# Patient Record
Sex: Female | Born: 1965
Health system: Southern US, Community
[De-identification: ages and names within clinical notes are randomized; demographics above are authoritative.]

## PROBLEM LIST (undated history)

## (undated) DIAGNOSIS — K6289 Other specified diseases of anus and rectum: Secondary | ICD-10-CM

## (undated) DIAGNOSIS — K602 Anal fissure, unspecified: Secondary | ICD-10-CM

## (undated) DIAGNOSIS — IMO0002 Reserved for concepts with insufficient information to code with codable children: Secondary | ICD-10-CM

## (undated) DIAGNOSIS — K649 Unspecified hemorrhoids: Secondary | ICD-10-CM

## (undated) DIAGNOSIS — Z87898 Personal history of other specified conditions: Secondary | ICD-10-CM

## (undated) HISTORY — DX: Reserved for concepts with insufficient information to code with codable children: IMO0002

---

## 1998-04-14 ENCOUNTER — Emergency Department (HOSPITAL_COMMUNITY): Admission: EM | Admit: 1998-04-14 | Discharge: 1998-04-14 | Payer: Self-pay | Admitting: Emergency Medicine

## 1998-04-14 DIAGNOSIS — IMO0002 Reserved for concepts with insufficient information to code with codable children: Secondary | ICD-10-CM

## 1998-04-14 HISTORY — DX: Reserved for concepts with insufficient information to code with codable children: IMO0002

## 2009-08-21 ENCOUNTER — Encounter: Admission: RE | Admit: 2009-08-21 | Discharge: 2009-08-21 | Payer: Self-pay | Admitting: Emergency Medicine

## 2011-09-30 ENCOUNTER — Other Ambulatory Visit: Payer: Self-pay | Admitting: Internal Medicine

## 2011-09-30 DIAGNOSIS — Z1231 Encounter for screening mammogram for malignant neoplasm of breast: Secondary | ICD-10-CM

## 2011-10-30 ENCOUNTER — Ambulatory Visit: Payer: Self-pay

## 2011-12-04 ENCOUNTER — Other Ambulatory Visit: Payer: Self-pay | Admitting: Internal Medicine

## 2011-12-04 DIAGNOSIS — Z1231 Encounter for screening mammogram for malignant neoplasm of breast: Secondary | ICD-10-CM

## 2012-01-04 ENCOUNTER — Ambulatory Visit (HOSPITAL_COMMUNITY)
Admission: RE | Admit: 2012-01-04 | Discharge: 2012-01-04 | Disposition: A | Discharge status: Home / Self Care | Payer: Self-pay | Source: Ambulatory Visit | Attending: Internal Medicine | Admitting: Internal Medicine

## 2012-01-04 DIAGNOSIS — Z1231 Encounter for screening mammogram for malignant neoplasm of breast: Secondary | ICD-10-CM

## 2012-08-14 ENCOUNTER — Ambulatory Visit: Payer: Self-pay | Admitting: Family Medicine

## 2012-08-14 ENCOUNTER — Encounter: Payer: Self-pay | Admitting: Family Medicine

## 2012-08-14 VITALS — BP 120/62 | HR 76 | Temp 97.9°F | Resp 20

## 2012-08-14 DIAGNOSIS — M79609 Pain in unspecified limb: Secondary | ICD-10-CM

## 2012-08-14 DIAGNOSIS — M79645 Pain in left finger(s): Secondary | ICD-10-CM

## 2012-08-14 DIAGNOSIS — S61209A Unspecified open wound of unspecified finger without damage to nail, initial encounter: Secondary | ICD-10-CM

## 2012-08-14 MED ORDER — HYDROCODONE-ACETAMINOPHEN 5-500 MG PO TABS: 1.0000 | ORAL_TABLET | Freq: Three times a day (TID) | ORAL | Status: DC | PRN

## 2012-08-14 NOTE — Patient Instructions (Signed)

## 2012-08-14 NOTE — Progress Notes (Signed)
Is 46 year old woman who was working on her blinds in the bathroom and slipped and when she fell she caught her ring finger on left on the top of the shower. He dug into her finger lacerating the volar surface. She's been unable to get the ring off and has had continued bleeding since. Last tetanus was over 10 years ago  Objective: Patient was brought back urgently Left ring finger was lacerated the base of the volar proximal phalanx with a ring still on and the proximal phalanx swollen to the point that the ring could not be removed until a ring cutter was applied. 1 cc of Xylocaine, plain, was injected into the volar metacarpal phalangeal area to achieve anesthesia in the ring was removed.  Examination of the finger reveal good range of motion. She does have good sensation prior to the anesthesia throughout the finger.  Wound was cleansed, reanesthetized with marcaine, and then a sterile field was established. The wound was closely inspected for damaged tissue, with only pulp visible, no deep structures such as N,A,T, Vein damaged.  Assessment:  Left ring finger laceration with contusion, intact deep structures.  Plan:   tdap given

## 2012-08-14 NOTE — Progress Notes (Signed)
Procedure: Consent obtained.  MC block with 50:50 1% lido with marcaine.  Anesthesia obtained.  Wound explored and was superficial.  No tendons or muscles seen.  Hemostasis with penrose drain due to superficial vessel damage.  Wound closed with 5-0 Ethilon #4 horizontal and #3 SI sutures for superficial closure.  Pressure drsg placed.  Wound care d/w pt.

## 2012-08-24 ENCOUNTER — Ambulatory Visit (INDEPENDENT_AMBULATORY_CARE_PROVIDER_SITE_OTHER): Payer: Self-pay | Admitting: Family Medicine

## 2012-08-24 VITALS — BP 132/83 | HR 68 | Temp 98.1°F | Resp 16 | Ht 69.5 in | Wt 162.2 lb

## 2012-08-24 DIAGNOSIS — S61409A Unspecified open wound of unspecified hand, initial encounter: Secondary | ICD-10-CM

## 2012-08-24 DIAGNOSIS — M79645 Pain in left finger(s): Secondary | ICD-10-CM

## 2012-08-24 DIAGNOSIS — M79609 Pain in unspecified limb: Secondary | ICD-10-CM

## 2012-08-24 NOTE — Progress Notes (Signed)
  Subjective:    Patient ID: Christy Ortiz, female    DOB: 09-Feb-1966, 46 y.o.   MRN: [id number]  HPI Christy Ortiz is a 46 y.o. female See last ov - 08/14/12 - laceration to L ring finger, s/p repair.  Here for suture removal.  Per last ov: Procedure: Consent obtained. MC block with 50:50 1% lido with marcaine. Anesthesia obtained. Wound explored and was superficial. No tendons or muscles seen. Hemostasis with penrose drain due to superficial vessel damage. Wound closed with 5-0 Ethilon #4 horizontal and #3 SI sutures for superficial closure. Pressure drsg placed. Wound care d/w pt.   Only occasional soreness, overall doing well, no redness or drainage. No itching or other new symptoms.  Had more soreness at 5th finger initially, and noticed bruising over the big knuckle, trouble with bending in end of pinky finger. Noticed this the past few days.   R hand dominant, bus driver.   Review of Systems     Objective:   Physical Exam  Constitutional: She is oriented to person, place, and time. She appears well-developed and well-nourished.  Pulmonary/Chest: Effort normal.  Musculoskeletal:       Left hand: She exhibits decreased range of motion (slight echyosis- faded over 5th PCP - dorsum, but from,  ).       Hands: Neurological: She is alert and oriented to person, place, and time.  Skin: Skin is warm and dry.       Assessment & Plan:  Christy Ortiz is a 46 y.o. female 1. Wound, open, hand with or without fingers   2. Pain of finger of left hand    Sutures # 7 removed without difficulty, no widening.  3 steristrips placed. NVI distally.  Wound care precautions discussed.   5th finger pain - discussed possible fracture at PIP and flexor tendon avulsion at DIP.  Declined xray or hand referral at this point.  Discussed possibility of long term weakness of 5th finger and inability to bend end of finger - long term if flexor avulsion. Understanding expressed.  Declined further eval at this  point but encouraged to RTC if she changes her mind, or we can refer to hand specialist.

## 2013-01-17 ENCOUNTER — Encounter (HOSPITAL_COMMUNITY): Payer: Self-pay | Admitting: Emergency Medicine

## 2013-01-17 ENCOUNTER — Emergency Department (HOSPITAL_COMMUNITY)
Admission: EM | Admit: 2013-01-17 | Discharge: 2013-01-17 | Disposition: A | Discharge status: Home / Self Care | Payer: BC Managed Care – PPO | Source: Home / Self Care

## 2013-01-17 DIAGNOSIS — K644 Residual hemorrhoidal skin tags: Secondary | ICD-10-CM

## 2013-01-17 DIAGNOSIS — K648 Other hemorrhoids: Secondary | ICD-10-CM

## 2013-01-17 MED ORDER — HYDROCORTISONE ACETATE 25 MG RE SUPP: 25.0000 mg | Freq: Three times a day (TID) | RECTAL | Status: DC

## 2013-01-17 NOTE — ED Provider Notes (Signed)
History     CSN: [id number]  Arrival date & time 01/17/13  1031   First MD Initiated Contact with Patient 01/17/13 1118      Chief Complaint  Patient presents with  . Hemorrhoids    (Consider location/radiation/quality/duration/timing/severity/associated sxs/prior treatment) HPI Comments: 47 year old female complaining of rectal pain and hemorrhoids for over a year. She had never been diagnosed with hemorrhoids in the past. She states the pain discomfort has been worse in the past 4-6 months and is occurring on a daily basis. She is applied several OTC topical medications, sitz baths, stool softeners. These have not offered significant relief. She states once several months ago she had mild bleeding. She has not seen primary care brother provider for this particular complaint.   History reviewed. No pertinent past medical history.  History reviewed. No pertinent past surgical history.  History reviewed. No pertinent family history.  History  Substance Use Topics  . Smoking status: Current Every Day Smoker  . Smokeless tobacco: Not on file  . Alcohol Use: No    OB History   Grav Para Term Preterm Abortions TAB SAB Ect Mult Living                  Review of Systems  Constitutional: Negative.   Respiratory: Negative.   Gastrointestinal: Positive for rectal pain. Negative for nausea, vomiting, abdominal pain, diarrhea, constipation and blood in stool.  Genitourinary: Negative.   Skin: Negative.   Psychiatric/Behavioral: Negative.     Allergies  Review of patient's allergies indicates no known allergies.  Home Medications   Current Outpatient Rx  Name  Route  Sig  Dispense  Refill  . HYDROcodone-acetaminophen (VICODIN) 5-500 MG per tablet   Oral   Take 1 tablet by mouth every 8 (eight) hours as needed for pain.   20 tablet   0   . hydrocortisone (ANUSOL-HC) 25 MG suppository   Rectal   Place 1 suppository (25 mg total) rectally 3 (three) times daily.   24  suppository   1     BP 115/65  Pulse 58  Temp(Src) 97.6 F (36.4 C) (Oral)  Resp 16  SpO2 100%  LMP 01/07/2013  Physical Exam  Nursing note and vitals reviewed. Constitutional: She is oriented to person, place, and time. She appears well-developed and well-nourished. No distress.  Neck: Normal range of motion. Neck supple.  Cardiovascular: Normal rate.   Pulmonary/Chest: Effort normal. No respiratory distress.  Genitourinary:  Anorectal exam reveals a small pink external structure at the 11:00 position of the anus. No bleeding. No thrombosed hemorrhoid external. Digital exam reveals tenderness to the right lateral wall. No other structures were palpated. No fissure visible. No bleeding visible.  Musculoskeletal: Normal range of motion. She exhibits no edema and no tenderness.  Neurological: She is alert and oriented to person, place, and time. No cranial nerve deficit. She exhibits normal muscle tone.  Skin: Skin is warm and dry.  Psychiatric: She has a normal mood and affect.    ED Course  Procedures (including critical care time)  Labs Reviewed - No data to display No results found.   1. Hemorrhoids, internal   2. External hemorrhoid       MDM  Continue your treatment applications at home including the stool softeners. Use the Anusol-HC suppositories 3 times a day. No straining If the pain and discomfort Is not significantly improved in 7-10 days call the above surgeon to obtain an appointment for additional evaluation and treatment.  Hayden Rasmussen, NP 01/17/13 1216

## 2013-01-17 NOTE — ED Notes (Signed)
Pt c/o hemmrhoids for the past 6 months continually flaring up and very painful. Has used ice packs, tucks, preparation H, salt bath, witch hazel with no relief. Feels discomfort when sitting. Has a bowel movement every other day. No blood in stool. Takes 6-8 Aleve or Advil a day for pain. Pt is alert and oriented.

## 2013-01-20 NOTE — ED Provider Notes (Signed)
Medical screening examination/treatment/procedure(s) were performed by resident physician or non-physician practitioner and as supervising physician I was immediately available for consultation/collaboration.   Barkley Bruns MD.   Linna Hoff, MD 01/20/13 [phone]

## 2013-02-01 NOTE — ED Notes (Signed)
Accessed for patient phone call 

## 2013-02-14 ENCOUNTER — Encounter (INDEPENDENT_AMBULATORY_CARE_PROVIDER_SITE_OTHER): Payer: Self-pay | Admitting: General Surgery

## 2013-02-14 ENCOUNTER — Ambulatory Visit (INDEPENDENT_AMBULATORY_CARE_PROVIDER_SITE_OTHER): Payer: BC Managed Care – PPO | Admitting: General Surgery

## 2013-02-14 VITALS — BP 120/78 | HR 86 | Temp 97.0°F | Resp 18 | Ht 70.0 in | Wt 159.0 lb

## 2013-02-14 DIAGNOSIS — K602 Anal fissure, unspecified: Secondary | ICD-10-CM | POA: Insufficient documentation

## 2013-02-14 HISTORY — DX: Anal fissure, unspecified: K60.2

## 2013-02-14 NOTE — Patient Instructions (Signed)
Use colace and miralax for stool softener Baby wipes after bowel movements Warm tub soaks 2-3 times a day Diltiazem cream to rectum 4 times a day

## 2013-02-14 NOTE — Progress Notes (Signed)
Subjective:     Patient ID: Christy Ortiz, female   DOB: 1966/08/28, 47 y.o.   MRN: [id number]  HPI We are asked to see the patient in consultation by Dr. Merla Riches to evaluate her for hemorrhoids. The patient is a 47 year old white female who has been experiencing severe rectal pain for the last 3-4 months. She has noticed some swelling posteriorly at the rectum. She only 1 episode of bleeding with bowel movements. She has been using stool softeners to keep her stool soft enough to move every day.  Review of Systems  Constitutional: Negative.   HENT: Negative.   Eyes: Negative.   Respiratory: Negative.   Cardiovascular: Negative.   Gastrointestinal: Positive for rectal pain.  Endocrine: Negative.   Genitourinary: Negative.   Musculoskeletal: Negative.   Skin: Negative.   Allergic/Immunologic: Negative.   Neurological: Negative.   Hematological: Negative.   Psychiatric/Behavioral: Negative.        Objective:   Physical Exam  Constitutional: She is oriented to person, place, and time. She appears well-developed and well-nourished.  HENT:  Head: Normocephalic and atraumatic.  Eyes: Conjunctivae and EOM are normal. Pupils are equal, round, and reactive to light.  Neck: Normal range of motion. Neck supple.  Cardiovascular: Normal rate, regular rhythm and normal heart sounds.   Pulmonary/Chest: Effort normal and breath sounds normal.  Abdominal: Soft. Bowel sounds are normal.  Genitourinary:  There is a small posterior sentinel tag posteriorly. At the base of this is an obvious anal fissure.  Musculoskeletal: Normal range of motion.  Neurological: She is alert and oriented to person, place, and time.  Skin: Skin is warm and dry.  Psychiatric: She has a normal mood and affect. Her behavior is normal.       Assessment:     The patient has a posterior anal fissure that is probably the source of her pain.     Plan:     At this point I would recommend warm tub soaks and baby  wipes. She should continue to use Colace and MiraLAX for stool softeners. I will prescribe diltiazem cream to be used 4 times a day. We will plan to see her back in one month to check her progress.

## 2013-03-07 ENCOUNTER — Ambulatory Visit (INDEPENDENT_AMBULATORY_CARE_PROVIDER_SITE_OTHER): Payer: BC Managed Care – PPO | Admitting: General Surgery

## 2013-03-07 ENCOUNTER — Encounter (INDEPENDENT_AMBULATORY_CARE_PROVIDER_SITE_OTHER): Payer: Self-pay | Admitting: General Surgery

## 2013-03-07 VITALS — BP 120/80 | HR 78 | Temp 98.5°F | Resp 14 | Ht 70.0 in | Wt 160.6 lb

## 2013-03-07 DIAGNOSIS — K602 Anal fissure, unspecified: Secondary | ICD-10-CM

## 2013-03-07 NOTE — Progress Notes (Signed)
Subjective:     Patient ID: Christy Ortiz, female   DOB: 11/13/1965, 47 y.o.   MRN: [id number]  HPI The patient is a 47 year old white female who we saw 3 weeks ago with a posterior anal fissure. She has been using the diltiazem cream faithfully. Her pain has slightly improved over the last 3 weeks but she still has enough pain everyday that time she needs to lie down and rest.  Review of Systems  Constitutional: Negative.   HENT: Negative.   Eyes: Negative.   Respiratory: Negative.   Cardiovascular: Negative.   Gastrointestinal: Positive for rectal pain.  Endocrine: Negative.   Genitourinary: Negative.   Musculoskeletal: Negative.   Skin: Negative.   Allergic/Immunologic: Negative.   Neurological: Negative.   Hematological: Negative.   Psychiatric/Behavioral: Negative.        Objective:   Physical Exam  Constitutional: She is oriented to person, place, and time. She appears well-developed and well-nourished.  HENT:  Head: Normocephalic and atraumatic.  Eyes: Conjunctivae and EOM are normal. Pupils are equal, round, and reactive to light.  Neck: Normal range of motion. Neck supple.  Cardiovascular: Normal rate, regular rhythm and normal heart sounds.   Pulmonary/Chest: Effort normal and breath sounds normal.  Abdominal: Soft. Bowel sounds are normal.  Genitourinary:  She continues to have a posterior anal fissure with a sentinel tag  Musculoskeletal: Normal range of motion.  Neurological: She is alert and oriented to person, place, and time.  Skin: Skin is warm and dry.  Psychiatric: She has a normal mood and affect. Her behavior is normal.       Assessment:     The patient continues to show signs of a posterior anal fissure     Plan:     At this point we will continue the diltiazem cream 4 times a day. If she needs a refill she will call back to the office this afternoon. We will plan to have her followup with Dr. Maisie Fus in the next months to discuss whether she will  need a sphincterotomy or other intervention to try to get this to heal.

## 2013-03-07 NOTE — Patient Instructions (Signed)
Continue diltiazem cream 4 times a day Will refer you to Dr. Maisie Fus

## 2013-04-04 ENCOUNTER — Encounter (INDEPENDENT_AMBULATORY_CARE_PROVIDER_SITE_OTHER): Payer: Self-pay | Admitting: General Surgery

## 2013-04-04 ENCOUNTER — Ambulatory Visit (INDEPENDENT_AMBULATORY_CARE_PROVIDER_SITE_OTHER): Payer: BC Managed Care – PPO | Admitting: General Surgery

## 2013-04-04 ENCOUNTER — Telehealth (INDEPENDENT_AMBULATORY_CARE_PROVIDER_SITE_OTHER): Payer: Self-pay | Admitting: General Surgery

## 2013-04-04 VITALS — BP 128/66 | HR 60 | Temp 98.7°F | Resp 16 | Ht 70.0 in | Wt 165.4 lb

## 2013-04-04 DIAGNOSIS — K602 Anal fissure, unspecified: Secondary | ICD-10-CM

## 2013-04-04 MED ORDER — LIDOCAINE 5 % EX OINT: TOPICAL_OINTMENT | CUTANEOUS | Status: DC | PRN

## 2013-04-04 NOTE — Progress Notes (Signed)
Chief Complaint  Patient presents with  . New Evaluation    eval anal fissure per PT    HISTORY: Christy Ortiz is a 47 y.o. female who presents to the office with anal pain.  This had been occurring for a couple months.  She has tried diltiazem cream and anusol suppositories in the past with some success.  She is still having daily pain.  Hard stools makes the symptoms worse.   It is continuousin nature.  Her bowel habits are regular and her bowel movements are soft with stool softeners.  Her fiber intake is dietart.  She denies any bleeding.  She has been using the diltiazem cream for about 6 weeks.    History reviewed. No pertinent past medical history.    History reviewed. No pertinent past surgical history.      Current Outpatient Prescriptions  Medication Sig Dispense Refill  . lidocaine (XYLOCAINE) 5 % ointment Apply topically as needed.  35.44 g  0   No current facility-administered medications for this visit.      No Known Allergies    Family History  Problem Relation Age of Onset  . Cancer Father     Pancreatic  . Cancer Maternal Aunt   . Cancer Maternal Uncle   . Cancer Maternal Grandmother     History   Social History  . Marital Status: Single    Spouse Name: N/A    Number of Children: N/A  . Years of Education: N/A   Social History Main Topics  . Smoking status: Former Smoker    Types: Cigarettes    Quit date: 02/13/2012  . Smokeless tobacco: Never Used  . Alcohol Use: No  . Drug Use: No  . Sexually Active: None   Other Topics Concern  . None   Social History Narrative  . None      REVIEW OF SYSTEMS - PERTINENT POSITIVES ONLY: Review of Systems - General ROS: negative for - chills, fever or weight loss Hematological and Lymphatic ROS: negative for - bleeding problems, blood clots or bruising Respiratory ROS: no cough, shortness of breath, or wheezing Cardiovascular ROS: no chest pain or dyspnea on exertion Gastrointestinal ROS: no abdominal pain,  change in bowel habits, or black or bloody stools Genito-Urinary ROS: no dysuria, trouble voiding, or hematuria  EXAM: Filed Vitals:   04/04/13 1045  BP: 128/66  Pulse: 60  Temp: 98.7 F (37.1 C)  Resp: 16    General appearance: alert and cooperative Resp: clear to auscultation bilaterally Cardio: regular rate and rhythm GI: soft, non-tender; bowel sounds normal; no masses,  no organomegaly Anal exam Findings: posterior fissure with purulence noted, no fluctuant masses, sphincter hypertrophy, unable to do internal exam due to pain    ASSESSMENT AND PLAN: [name] is a 47 y.o. F with anal pain.  This is getting better, but she is still having pain throughout the day and not much with BM's.  She definitely has an anal fissure on exam, but given her abnormal symptoms, I have recommended an EUA.  If she has significant sphincter hypertension then, I will perform a chemical sphincterotomy to aid in healing.  I have also told her that smoking will slow healing as well.  She states that she has quit.      [name], MD Colon and Rectal Surgery / General Surgery [facility]      Visit Diagnoses: 1. Anal fissure     Primary Care Physician: Tonye Pearson,  MD   

## 2013-04-04 NOTE — Telephone Encounter (Signed)
Pt made aware of financial responsibility will call back when can schedule

## 2013-04-04 NOTE — Patient Instructions (Signed)

## 2013-04-14 ENCOUNTER — Encounter (HOSPITAL_BASED_OUTPATIENT_CLINIC_OR_DEPARTMENT_OTHER): Payer: Self-pay | Admitting: *Deleted

## 2013-04-17 ENCOUNTER — Encounter (HOSPITAL_BASED_OUTPATIENT_CLINIC_OR_DEPARTMENT_OTHER): Payer: Self-pay | Admitting: *Deleted

## 2013-04-17 NOTE — Progress Notes (Signed)
NPO AFTER MN. ARRIVES AT 1100. NEEDS HG.

## 2013-04-24 ENCOUNTER — Encounter (HOSPITAL_BASED_OUTPATIENT_CLINIC_OR_DEPARTMENT_OTHER)
Admission: RE | Disposition: A | Discharge status: Home / Self Care | Payer: Self-pay | Source: Ambulatory Visit | Attending: General Surgery

## 2013-04-24 ENCOUNTER — Encounter (HOSPITAL_BASED_OUTPATIENT_CLINIC_OR_DEPARTMENT_OTHER): Payer: Self-pay | Admitting: Anesthesiology

## 2013-04-24 ENCOUNTER — Ambulatory Visit (HOSPITAL_BASED_OUTPATIENT_CLINIC_OR_DEPARTMENT_OTHER): Payer: BC Managed Care – PPO | Admitting: Anesthesiology

## 2013-04-24 ENCOUNTER — Encounter (HOSPITAL_BASED_OUTPATIENT_CLINIC_OR_DEPARTMENT_OTHER): Payer: Self-pay

## 2013-04-24 ENCOUNTER — Ambulatory Visit (HOSPITAL_BASED_OUTPATIENT_CLINIC_OR_DEPARTMENT_OTHER)
Admission: RE | Admit: 2013-04-24 | Discharge: 2013-04-24 | Disposition: A | Discharge status: Home / Self Care | Payer: BC Managed Care – PPO | Source: Ambulatory Visit | Attending: General Surgery | Admitting: General Surgery

## 2013-04-24 DIAGNOSIS — K602 Anal fissure, unspecified: Secondary | ICD-10-CM | POA: Insufficient documentation

## 2013-04-24 DIAGNOSIS — K644 Residual hemorrhoidal skin tags: Secondary | ICD-10-CM | POA: Insufficient documentation

## 2013-04-24 DIAGNOSIS — I10 Essential (primary) hypertension: Secondary | ICD-10-CM | POA: Insufficient documentation

## 2013-04-24 DIAGNOSIS — K6289 Other specified diseases of anus and rectum: Secondary | ICD-10-CM | POA: Insufficient documentation

## 2013-04-24 HISTORY — DX: Unspecified hemorrhoids: K64.9

## 2013-04-24 HISTORY — PX: SPHINCTEROTOMY: SHX5279

## 2013-04-24 HISTORY — DX: Personal history of other specified conditions: Z87.898

## 2013-04-24 HISTORY — DX: Anal fissure, unspecified: K60.2

## 2013-04-24 HISTORY — PX: EXAMINATION UNDER ANESTHESIA: SHX1540

## 2013-04-24 HISTORY — DX: Other specified diseases of anus and rectum: K62.89

## 2013-04-24 SURGERY — EXAM UNDER ANESTHESIA
Anesthesia: Monitor Anesthesia Care | Site: Anus

## 2013-04-24 MED ORDER — PROPOFOL 10 MG/ML IV BOLUS
INTRAVENOUS | Status: DC | PRN
  Administered 2013-04-24: 40 mg via INTRAVENOUS
  Administered 2013-04-24: 25 mg via INTRAVENOUS

## 2013-04-24 MED ORDER — FENTANYL CITRATE 0.05 MG/ML IJ SOLN
INTRAMUSCULAR | Status: DC | PRN
  Administered 2013-04-24: 100 ug via INTRAVENOUS

## 2013-04-24 MED ORDER — SODIUM CHLORIDE 0.9 % IJ SOLN
INTRAMUSCULAR | Status: DC | PRN
  Administered 2013-04-24: 50 mL via INTRAVENOUS

## 2013-04-24 MED ORDER — PROPOFOL 10 MG/ML IV EMUL
INTRAVENOUS | Status: DC | PRN
  Administered 2013-04-24: 50 ug/kg/min via INTRAVENOUS

## 2013-04-24 MED ORDER — ONABOTULINUMTOXINA 100 UNITS IJ SOLR
INTRAMUSCULAR | Status: DC | PRN
  Administered 2013-04-24: 100 [IU] via INTRAMUSCULAR

## 2013-04-24 MED ORDER — MIDAZOLAM HCL 5 MG/5ML IJ SOLN
INTRAMUSCULAR | Status: DC | PRN
  Administered 2013-04-24: 2 mg via INTRAVENOUS

## 2013-04-24 MED ORDER — BUPIVACAINE-EPINEPHRINE PF 0.5-1:200000 % IJ SOLN
INTRAMUSCULAR | Status: DC | PRN
  Administered 2013-04-24: 20 mL

## 2013-04-24 MED ORDER — LACTATED RINGERS IV SOLN
INTRAVENOUS | Status: DC
  Administered 2013-04-24: 12:00:00 via INTRAVENOUS
  Filled 2013-04-24: qty 1000

## 2013-04-24 MED ORDER — LIDOCAINE HCL (CARDIAC) 20 MG/ML IV SOLN
INTRAVENOUS | Status: DC | PRN
  Administered 2013-04-24: 60 mg via INTRAVENOUS

## 2013-04-24 SURGICAL SUPPLY — 53 items
APL SKNCLS STERI-STRIP NONHPOA (GAUZE/BANDAGES/DRESSINGS) ×1
BENZOIN TINCTURE PRP APPL 2/3 (GAUZE/BANDAGES/DRESSINGS) ×2 IMPLANT
BLADE HEX COATED 2.75 (ELECTRODE) ×2 IMPLANT
BLADE SURG 10 STRL SS (BLADE) IMPLANT
BLADE SURG 15 STRL LF DISP TIS (BLADE) ×1 IMPLANT
BLADE SURG 15 STRL SS (BLADE) ×2
BRIEF STRETCH FOR OB PAD LRG (UNDERPADS AND DIAPERS) ×2 IMPLANT
CANISTER SUCTION 2500CC (MISCELLANEOUS) ×2 IMPLANT
CLOTH BEACON ORANGE TIMEOUT ST (SAFETY) ×2 IMPLANT
COVER MAYO STAND STRL (DRAPES) ×2 IMPLANT
COVER TABLE BACK 60X90 (DRAPES) ×2 IMPLANT
DECANTER SPIKE VIAL GLASS SM (MISCELLANEOUS) ×2 IMPLANT
DRAIN PENROSE 18X1/2 LTX STRL (DRAIN) IMPLANT
DRAPE LG THREE QUARTER DISP (DRAPES) ×4 IMPLANT
DRAPE PED LAPAROTOMY (DRAPES) ×2 IMPLANT
DRAPE UNDERBUTTOCKS STRL (DRAPE) IMPLANT
DRSG PAD ABDOMINAL 8X10 ST (GAUZE/BANDAGES/DRESSINGS) ×1 IMPLANT
ELECT BLADE 6.5 .24CM SHAFT (ELECTRODE) IMPLANT
ELECT REM PT RETURN 9FT ADLT (ELECTROSURGICAL) ×2
ELECTRODE REM PT RTRN 9FT ADLT (ELECTROSURGICAL) ×1 IMPLANT
GAUZE SPONGE 4X4 16PLY XRAY LF (GAUZE/BANDAGES/DRESSINGS) ×2 IMPLANT
GLOVE BIO SURGEON STRL SZ 6 (GLOVE) ×1 IMPLANT
GLOVE BIO SURGEON STRL SZ 6.5 (GLOVE) ×5 IMPLANT
GLOVE BIOGEL PI IND STRL 7.0 (GLOVE) ×1 IMPLANT
GLOVE BIOGEL PI INDICATOR 7.0 (GLOVE) ×1
GLOVE INDICATOR 7.0 STRL GRN (GLOVE) ×2 IMPLANT
GLOVE INDICATOR 7.5 STRL GRN (GLOVE) ×2 IMPLANT
GLOVE SKINSENSE NS SZ7.5 (GLOVE) ×1
GLOVE SKINSENSE STRL SZ7.5 (GLOVE) IMPLANT
GOWN PREVENTION PLUS XXLARGE (GOWN DISPOSABLE) ×2 IMPLANT
GOWN STRL REIN XL XLG (GOWN DISPOSABLE) ×4 IMPLANT
HEMOSTAT SURGICEL 2X14 (HEMOSTASIS) ×2 IMPLANT
LEGGING LITHOTOMY PAIR STRL (DRAPES) IMPLANT
NDL HYPO 25X1 1.5 SAFETY (NEEDLE) ×1 IMPLANT
NDL SAFETY ECLIPSE 18X1.5 (NEEDLE) ×1 IMPLANT
NEEDLE HYPO 18GX1.5 SHARP (NEEDLE) ×2
NEEDLE HYPO 22GX1.5 SAFETY (NEEDLE) ×2 IMPLANT
NEEDLE HYPO 25X1 1.5 SAFETY (NEEDLE) ×2 IMPLANT
NS IRRIG 500ML POUR BTL (IV SOLUTION) ×2 IMPLANT
PACK BASIN DAY SURGERY FS (CUSTOM PROCEDURE TRAY) ×2 IMPLANT
PENCIL BUTTON HOLSTER BLD 10FT (ELECTRODE) ×2 IMPLANT
SPONGE GAUZE 4X4 12PLY (GAUZE/BANDAGES/DRESSINGS) ×1 IMPLANT
SPONGE SURGIFOAM ABS GEL 100 (HEMOSTASIS) ×2 IMPLANT
SPONGE SURGIFOAM ABS GEL 12-7 (HEMOSTASIS) IMPLANT
SUT CHROMIC 2 0 SH (SUTURE) IMPLANT
SUT CHROMIC 3 0 SH 27 (SUTURE) IMPLANT
SYR CONTROL 10ML LL (SYRINGE) ×2 IMPLANT
TOWEL OR 17X24 6PK STRL BLUE (TOWEL DISPOSABLE) ×4 IMPLANT
TRAY DSU PREP LF (CUSTOM PROCEDURE TRAY) ×2 IMPLANT
TUBE CONNECTING 12X1/4 (SUCTIONS) ×2 IMPLANT
UNDERPAD 30X30 INCONTINENT (UNDERPADS AND DIAPERS) ×2 IMPLANT
WATER STERILE IRR 500ML POUR (IV SOLUTION) ×2 IMPLANT
YANKAUER SUCT BULB TIP NO VENT (SUCTIONS) ×2 IMPLANT

## 2013-04-24 NOTE — Anesthesia Postprocedure Evaluation (Signed)
  Anesthesia Post-op Note  Patient: Christy Ortiz  Procedure(s) Performed: Procedure(s) (LRB): EXAM UNDER ANESTHESIA (N/A) SPHINCTEROTOMY chemical  (N/A)  Patient Location: PACU  Anesthesia Type: MAC  Level of Consciousness: awake and alert   Airway and Oxygen Therapy: Patient Spontanous Breathing  Post-op Pain: mild  Post-op Assessment: Post-op Vital signs reviewed, Patient's Cardiovascular Status Stable, Respiratory Function Stable, Patent Airway and No signs of Nausea or vomiting  Last Vitals:  Filed Vitals:   04/24/13 1315  BP: 101/52  Pulse: 69  Temp:   Resp: 21    Post-op Vital Signs: stable   Complications: No apparent anesthesia complications

## 2013-04-24 NOTE — Transfer of Care (Signed)
Immediate Anesthesia Transfer of Care Note  Patient: Christy Ortiz  Procedure(s) Performed: Procedure(s): EXAM UNDER ANESTHESIA (N/A) SPHINCTEROTOMY chemical  (N/A)  Patient Location: PACU  Anesthesia Type:MAC  Level of Consciousness: awake, alert  and oriented  Airway & Oxygen Therapy: Patient Spontanous Breathing  Post-op Assessment: Report given to PACU RN  Post vital signs: Reviewed and stable  Complications: No apparent anesthesia complications

## 2013-04-24 NOTE — H&P (View-Only) (Signed)
Chief Complaint  Patient presents with  . New Evaluation    eval anal fissure per PT    HISTORY: Christy Ortiz is a 47 y.o. female who presents to the office with anal pain.  This had been occurring for a couple months.  She has tried diltiazem cream and anusol suppositories in the past with some success.  She is still having daily pain.  Hard stools makes the symptoms worse.   It is continuousin nature.  Her bowel habits are regular and her bowel movements are soft with stool softeners.  Her fiber intake is dietart.  She denies any bleeding.  She has been using the diltiazem cream for about 6 weeks.    History reviewed. No pertinent past medical history.    History reviewed. No pertinent past surgical history.      Current Outpatient Prescriptions  Medication Sig Dispense Refill  . lidocaine (XYLOCAINE) 5 % ointment Apply topically as needed.  35.44 g  0   No current facility-administered medications for this visit.      No Known Allergies    Family History  Problem Relation Age of Onset  . Cancer Father     Pancreatic  . Cancer Maternal Aunt   . Cancer Maternal Uncle   . Cancer Maternal Grandmother     History   Social History  . Marital Status: Single    Spouse Name: N/A    Number of Children: N/A  . Years of Education: N/A   Social History Main Topics  . Smoking status: Former Smoker    Types: Cigarettes    Quit date: 02/13/2012  . Smokeless tobacco: Never Used  . Alcohol Use: No  . Drug Use: No  . Sexually Active: None   Other Topics Concern  . None   Social History Narrative  . None      REVIEW OF SYSTEMS - PERTINENT POSITIVES ONLY: Review of Systems - General ROS: negative for - chills, fever or weight loss Hematological and Lymphatic ROS: negative for - bleeding problems, blood clots or bruising Respiratory ROS: no cough, shortness of breath, or wheezing Cardiovascular ROS: no chest pain or dyspnea on exertion Gastrointestinal ROS: no abdominal pain,  change in bowel habits, or black or bloody stools Genito-Urinary ROS: no dysuria, trouble voiding, or hematuria  EXAM: Filed Vitals:   04/04/13 1045  BP: 128/66  Pulse: 60  Temp: 98.7 F (37.1 C)  Resp: 16    General appearance: alert and cooperative Resp: clear to auscultation bilaterally Cardio: regular rate and rhythm GI: soft, non-tender; bowel sounds normal; no masses,  no organomegaly Anal exam Findings: posterior fissure with purulence noted, no fluctuant masses, sphincter hypertrophy, unable to do internal exam due to pain    ASSESSMENT AND PLAN: Christy Ortiz is a 47 y.o. F with anal pain.  This is getting better, but she is still having pain throughout the day and not much with BM's.  She definitely has an anal fissure on exam, but given her abnormal symptoms, I have recommended an EUA.  If she has significant sphincter hypertension then, I will perform a chemical sphincterotomy to aid in healing.  I have also told her that smoking will slow healing as well.  She states that she has quit.      Christy Ortiz C Christy Friedel, MD Colon and Rectal Surgery / General Surgery [facility]      Visit Diagnoses: 1. Anal fissure     Primary Care Physician: DOOLITTLE, ROBERT P,   MD   

## 2013-04-24 NOTE — Anesthesia Preprocedure Evaluation (Signed)
Anesthesia Evaluation  Patient identified by MRN, date of birth, ID band Patient awake    Reviewed: Allergy & Precautions, H&P , NPO status , Patient's Chart, lab work & pertinent test results  Airway Mallampati: II TM Distance: >3 FB Neck ROM: Full    Dental no notable dental hx.    Pulmonary former smoker,  breath sounds clear to auscultation  Pulmonary exam normal       Cardiovascular negative cardio ROS  Rhythm:Regular Rate:Normal     Neuro/Psych Seizures - (pseudoseizure 2000),  negative psych ROS   GI/Hepatic negative GI ROS, Neg liver ROS,   Endo/Other  negative endocrine ROS  Renal/GU negative Renal ROS  negative genitourinary   Musculoskeletal negative musculoskeletal ROS (+)   Abdominal   Peds negative pediatric ROS (+)  Hematology negative hematology ROS (+)   Anesthesia Other Findings   Reproductive/Obstetrics negative OB ROS                           Anesthesia Physical Anesthesia Plan  ASA: II  Anesthesia Plan: MAC   Post-op Pain Management:    Induction:   Airway Management Planned: Simple Face Mask  Additional Equipment:   Intra-op Plan:   Post-operative Plan:   Informed Consent: I have reviewed the patients History and Physical, chart, labs and discussed the procedure including the risks, benefits and alternatives for the proposed anesthesia with the patient or authorized representative who has indicated his/her understanding and acceptance.   Dental advisory given  Plan Discussed with: CRNA  Anesthesia Plan Comments:         Anesthesia Quick Evaluation

## 2013-04-24 NOTE — Op Note (Signed)
04/24/2013  12:57 PM  PATIENT:  Christy Ortiz  47 y.o. female  Patient Care Team: Tonye Pearson, MD as PCP - General (Family Medicine)  PRE-OPERATIVE DIAGNOSIS:  anal pain anal fissure   POST-OPERATIVE DIAGNOSIS:  anal pain anal fissure   PROCEDURE: EXAM UNDER ANESTHESIA SPHINCTEROTOMY chemical   SURGEON:  Surgeon(s): Romie Levee, MD  ASSISTANT: none   ANESTHESIA:   local and MAC  EBL: min   Total I/O In: 100 [I.V.:100] Out: -   COUNTS:  YES  PLAN OF CARE: Discharge to home after PACU  PATIENT DISPOSITION:  PACU - hemodynamically stable.  INDICATION: posterior anal fissure, unresponsive to medical treatment  OR FINDINGS: posterior anal fissure, posterior skin tag, anal sphincter hypertension  DESCRIPTION: The patient was identified in the preoperative holding area and taken to the OR where they were laid prone on the operating room table in the jack knife position. MAC anesthesia was smoothly induced.  The patient was then prepped and draped in the usual sterile fashion. A surgical timeout was performed indicating the correct patient, procedure, positioning and preoperative antibiotics. SCDs were noted to be in place and functioning prior to the operation.   I began by performing a rectal block using half percent Marcaine with epinephrine. I then performed a digital rectal exam. There are no masses palpated. I then placed a Hill-Ferguson anoscope into the anal canal for evaluation. The patient had some moderate external hemorrhoids. There was minimal internal hemorrhoid disease. There was a posterior anal skin tag with an associated posterior anal fissure. This was approximately 1 cm in diameter. Upon placing the Hill-Ferguson anoscope into the anal canal there was significant sphincter hypertension noted. Given the non-healing posterior fissure and this hypertension it was decided to perform a chemical sphincterotomy.  100 units of Botox was mixed with 50 mL of normal  saline. This was then brought onto the field. 5 mL of this mixture was then injected in all 4 quadrants of the anal canal into the intersphincteric groove. This was a total of 40 units of Botox. After this was completed the operation was concluded. The patient was awakened from anesthesia and sent to the post anesthesia care unit in stable condition. All counts were correct per operating room staff.

## 2013-04-24 NOTE — Interval H&P Note (Signed)
History and Physical Interval Note:  04/24/2013 12:25 PM  Christy Ortiz  has presented today for surgery, with the diagnosis of anal pain anal fissure   The various methods of treatment have been discussed with the patient and family. After consideration of risks, benefits and other options for treatment, the patient has consented to  Procedure(s): EXAM UNDER ANESTHESIA (N/A) SPHINCTEROTOMY chemical  (N/A) as a surgical intervention .  The patient's history has been reviewed, patient examined, no change in status, stable for surgery.  I have reviewed the patient's chart and labs.  Questions were answered to the patient's satisfaction.     [name], MD  Colorectal and General Surgery [facility]

## 2013-04-25 ENCOUNTER — Encounter (HOSPITAL_BASED_OUTPATIENT_CLINIC_OR_DEPARTMENT_OTHER): Payer: Self-pay | Admitting: General Surgery

## 2013-05-30 ENCOUNTER — Encounter (INDEPENDENT_AMBULATORY_CARE_PROVIDER_SITE_OTHER): Payer: Self-pay | Admitting: General Surgery

## 2013-05-30 ENCOUNTER — Ambulatory Visit (INDEPENDENT_AMBULATORY_CARE_PROVIDER_SITE_OTHER): Payer: BC Managed Care – PPO | Admitting: General Surgery

## 2013-05-30 VITALS — BP 112/68 | HR 72 | Resp 14 | Ht 70.0 in | Wt 156.2 lb

## 2013-05-30 DIAGNOSIS — K602 Anal fissure, unspecified: Secondary | ICD-10-CM

## 2013-05-30 NOTE — Patient Instructions (Addendum)
Continue current management.  Return to the office in 4 weeks for recheck

## 2013-05-30 NOTE — Progress Notes (Signed)
Christy Ortiz is a 47 y.o. female who is status post a chemical sphincterotomy on 8/11.  She is doing better now.  Her pain is getting better as well.  She did have some purulent bloody drainage at first, but this has improved  Objective: Filed Vitals:   05/30/13 1113  BP: 112/68  Pulse: 72  Resp: 14    General appearance: alert and cooperative GI: normal findings: soft, non-tender  Incision: healing well, purulence noted DRE: no fluctuant masses   Assessment: s/p  Patient Active Problem List   Diagnosis Date Noted  . Anal fissure 02/14/2013    Plan: Seems to be improving clinically.  Would like to recheck in 4 wks to confirm healing.    [name], MD [facility], [place] [phone]   05/30/2013 11:41 AM

## 2013-06-27 ENCOUNTER — Encounter (INDEPENDENT_AMBULATORY_CARE_PROVIDER_SITE_OTHER): Payer: BC Managed Care – PPO | Admitting: General Surgery

## 2015-08-12 ENCOUNTER — Encounter: Payer: Self-pay | Admitting: Internal Medicine

## 2016-02-12 ENCOUNTER — Ambulatory Visit (INDEPENDENT_AMBULATORY_CARE_PROVIDER_SITE_OTHER): Payer: Self-pay | Admitting: Physician Assistant

## 2016-02-12 ENCOUNTER — Ambulatory Visit (INDEPENDENT_AMBULATORY_CARE_PROVIDER_SITE_OTHER): Payer: Self-pay

## 2016-02-12 VITALS — BP 136/82 | HR 68 | Temp 97.8°F | Resp 18 | Ht 70.0 in | Wt 164.0 lb

## 2016-02-12 DIAGNOSIS — R55 Syncope and collapse: Secondary | ICD-10-CM | POA: Insufficient documentation

## 2016-02-12 DIAGNOSIS — R059 Cough, unspecified: Secondary | ICD-10-CM

## 2016-02-12 DIAGNOSIS — R05 Cough: Secondary | ICD-10-CM

## 2016-02-12 DIAGNOSIS — G4700 Insomnia, unspecified: Secondary | ICD-10-CM

## 2016-02-12 DIAGNOSIS — R071 Chest pain on breathing: Secondary | ICD-10-CM

## 2016-02-12 MED ORDER — GUAIFENESIN ER 1200 MG PO TB12: 1.0000 | ORAL_TABLET | Freq: Two times a day (BID) | ORAL | Status: DC | PRN

## 2016-02-12 MED ORDER — HYDROCOD POLST-CPM POLST ER 10-8 MG/5ML PO SUER: 5.0000 mL | Freq: Two times a day (BID) | ORAL | Status: DC | PRN

## 2016-02-12 MED ORDER — IPRATROPIUM BROMIDE 0.03 % NA SOLN: 2.0000 | Freq: Two times a day (BID) | NASAL | Status: DC

## 2016-02-12 MED ORDER — AZITHROMYCIN 500 MG PO TABS: 500.0000 mg | ORAL_TABLET | Freq: Every day | ORAL | Status: AC

## 2016-02-12 MED ORDER — SERTRALINE HCL 50 MG PO TABS: 50.0000 mg | ORAL_TABLET | Freq: Every day | ORAL | Status: DC

## 2016-02-12 MED ORDER — HYDROXYZINE HCL 25 MG PO TABS: 12.5000 mg | ORAL_TABLET | Freq: Three times a day (TID) | ORAL | Status: DC | PRN

## 2016-02-12 NOTE — Patient Instructions (Addendum)
     IF you received an x-ray today, you will receive an invoice from [facility]. Please contact [facility] at [phone] with questions or concerns regarding your invoice.   IF you received labwork today, you will receive an invoice from [facility]. Please contact [facility] at [phone] with questions or concerns regarding your invoice.   Our billing staff will not be able to assist you with questions regarding bills from these companies.  You will be contacted with the lab results as soon as they are available. The fastest way to get your results is to activate your My Chart account. Instructions are located on the last page of this paperwork. If you have not heard from us regarding the results in 2 weeks, please contact this office.     We recommend that you schedule a mammogram for breast cancer screening. Typically, you do not need a referral to do this. Please contact a local imaging center to schedule your mammogram.  [facility] - [phone]  *ask for the Radiology Department [facility]) - [phone] or [phone]  [facility] - [phone] [facility] - [phone] [facility] - [phone]  *ask for the Radiology Department [facility] - [phone]  *ask for the Radiology Department [facility] - [phone]  *ask for the Mammography Department [facility] - [phone]  
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
## 2016-03-18 ENCOUNTER — Encounter: Payer: Self-pay | Admitting: Physician Assistant

## 2016-03-18 DIAGNOSIS — F17200 Nicotine dependence, unspecified, uncomplicated: Secondary | ICD-10-CM | POA: Insufficient documentation

## 2016-05-01 LAB — HM MAMMOGRAPHY: HM Mammogram: NORMAL (ref 0–4)

## 2017-12-20 ENCOUNTER — Encounter: Payer: Self-pay | Admitting: Physician Assistant

## 2019-10-17 ENCOUNTER — Other Ambulatory Visit (HOSPITAL_COMMUNITY): Payer: Self-pay | Admitting: *Deleted

## 2019-10-17 DIAGNOSIS — Z1231 Encounter for screening mammogram for malignant neoplasm of breast: Secondary | ICD-10-CM

## 2019-11-14 ENCOUNTER — Ambulatory Visit: Payer: Self-pay | Admitting: Advanced Practice Midwife

## 2019-11-14 ENCOUNTER — Encounter (INDEPENDENT_AMBULATORY_CARE_PROVIDER_SITE_OTHER): Payer: Self-pay

## 2019-11-14 ENCOUNTER — Encounter: Payer: Self-pay | Admitting: Advanced Practice Midwife

## 2019-11-14 ENCOUNTER — Other Ambulatory Visit: Payer: Self-pay

## 2019-11-14 VITALS — BP 146/90 | Temp 98.4°F | Wt 165.0 lb

## 2019-11-14 DIAGNOSIS — Z1239 Encounter for other screening for malignant neoplasm of breast: Secondary | ICD-10-CM

## 2019-11-14 DIAGNOSIS — Z124 Encounter for screening for malignant neoplasm of cervix: Secondary | ICD-10-CM

## 2019-11-14 NOTE — Progress Notes (Signed)
[name] is a 54 y.o. No obstetric history on file. female who presents to [facility] clinic today with no complaints.    Pap Smear: Pap smear completed today. Last Pap smear was apprx 15 years ago at unknown clinic and was normal per patient. . Per patient has no history of an abnormal Pap smear. Last Pap smear result is not available in Epic.   Physical exam: Breasts Breasts symmetrical. No skin abnormalities bilateral breasts. No nipple retraction bilateral breasts. No nipple discharge bilateral breasts. No lymphadenopathy. No lumps palpated bilateral breasts.       Pelvic/Bimanual Ext Genitalia No lesions, no swelling and no discharge observed on external genitalia.        Vagina Vagina pink and normal texture. No lesions or discharge observed in vagina.        Cervix Cervix is present. Cervix pink and of normal texture. No discharge observed.    Uterus Uterus is present and palpable. Uterus in normal position and normal size.        Adnexae Bilateral ovaries present and palpable. No tenderness on palpation.         Rectovaginal No rectal exam completed today since patient had no rectal complaints. No skin abnormalities observed on exam.     Smoking History: Patient has is a current smoker at 1.5 PPD packs per day referred to quit line.    Patient Navigation: Patient education provided. Access to services provided for patient through B[facility]CP program.   Colorectal Cancer Screening: Per patient has never had colonoscopy completed No complaints today.    Breast and Cervical Cancer Risk Assessment: Patient does not have family history of breast cancer, known genetic mutations, or radiation treatment to the chest before age 38. Patient does not have history of cervical dysplasia, immunocompromised, or DES exposure in-utero.  Risk Assessment    Risk Scores      11/14/2019   Last edited by: Christy Rutherford, LPN   5-year risk: 1.2 %   Lifetime risk: 8.3 %           A: B[facility]CP exam with pap smear Complaint of none Incidentally noted that BP was elevated today. Will refer to [facility]   P: [facility] for a screening mammogram. Appointment scheduled 11/29/2019.  Thressa Sheller DNP, CNM  11/14/19  9:45 AM

## 2019-11-15 LAB — CYTOLOGY - PAP
Comment: NEGATIVE
Diagnosis: NEGATIVE
High risk HPV: NEGATIVE

## 2019-11-29 ENCOUNTER — Other Ambulatory Visit: Payer: Self-pay

## 2019-11-29 ENCOUNTER — Other Ambulatory Visit: Payer: Self-pay | Admitting: Obstetrics and Gynecology

## 2019-11-29 ENCOUNTER — Inpatient Hospital Stay: Payer: Self-pay | Attending: Obstetrics and Gynecology | Admitting: *Deleted

## 2019-11-29 ENCOUNTER — Ambulatory Visit
Admission: RE | Admit: 2019-11-29 | Discharge: 2019-11-29 | Disposition: A | Discharge status: Home / Self Care | Payer: No Typology Code available for payment source | Source: Ambulatory Visit | Attending: Obstetrics and Gynecology | Admitting: Obstetrics and Gynecology

## 2019-11-29 DIAGNOSIS — Z1231 Encounter for screening mammogram for malignant neoplasm of breast: Secondary | ICD-10-CM

## 2019-11-29 DIAGNOSIS — Z0000 Encounter for general adult medical examination without abnormal findings: Secondary | ICD-10-CM

## 2019-11-29 NOTE — Progress Notes (Signed)
[facility] initial screening     Clinical Measurement:  Height: 70 in Weight: 164 lb  Blood Pressure: 140/90  Blood Pressure #2: 148/90 Fasting Labs Drawn Today, will review with patient when they result.   Medical History: Patient states that she does not have a history of high cholesterol, high blood pressure or diabetes.  Medications:  Patient states that she does not take medication to lower cholesterol, blood pressure or blood sugar. Patient does not take an aspirin a day to help prevent a heart attack or stroke. During the past 7 days patient has taken prescribed medication to lower on days.   Blood pressure, self measurement: Patient states that she does measure blood pressure from home and has not been told to do so by a healthcare provider.   Nutrition: Patient states that on average she eats 0 cups of fruit and 0 cups of vegetables per day. Patient states that she does not eat fish at least 2 times per week. Patient eats less than half servings of whole grains. Patient does not drink less than 36 ounces of beverages with added sugar weekly. Patient is currently watching sodium or salt intake. In the past 7 days patient has not had any drinks containing alcohol. On average patient does not drink any drinks containing alcohol.      Physical activity:  Patient states that she gets 0 minutes of moderate and 0 minutes of vigorous physical activity each week.  Smoking status:  Patient states that she is a current tobacco smoker.   Quality of life:  Over the past 2 weeks patient states that she has had several days where she has little interest or pleasure in doing things and several days where she has felt down, depressed or hopeless.    Risk reduction and counseling:    Smoking Cessation: Spoke with patient about quitting smoking. Patient stated that she does want to quit. Patient stated that in the past she had quit smoking using Chantix. Patient asked if I could give her a prescription for  Chantix, informed her that would be something she could speak with the doctor about when she goes for her follow-up appointment for elevated BP. Told patient about the [place] and offered to send a referral for her to them. Patient declined a referral at this time. Also informed patient about the smoking cessation classes offered through [facility]. Patient did not want to sign up for a class at this time. Told patient if she changes her mind to give me a call and I would be happy to refer her.   Health Coaching: Talked with patient about adding more fruits and vegetables in diet. Explained that the recommendation is for 2 cups of fruit and 3 cups of vegetables per day. Showed patient what a serving looks like. Also spoke with the patient about adding heart healthy fish and whole grains in diet. Gave examples of different foods to try. We also discussed reducing the amount of beverages with added sugars consumed and how too much sodas can increase your risk for prediabetes and diabetes. Patient currently drinks on average around (2) 2 Liters of soda a week. Also discussed the importance of trying to add in daily exercise into routine.   Navigation:  I will notify patient of lab results.  Patient is aware of 2 more health coaching sessions and a follow up. Gave patient resource list for looking places in [place] that offer counseling services. Will call patient with follow-up appointment information  for Internal Medicine once appointment is scheduled.  Time: 20 minutes

## 2019-11-30 LAB — LIPID PANEL W/O CHOL/HDL RATIO
Cholesterol, Total: 132 mg/dL (ref 100–199)
HDL: 35 mg/dL — ABNORMAL LOW (ref >39)
LDL Chol Calc (NIH): 82 mg/dL (ref 0–99)
Triglycerides: 72 mg/dL (ref 0–149)
VLDL Cholesterol Cal: 15 mg/dL (ref 5–40)

## 2019-11-30 LAB — HGB A1C W/O EAG: Hgb A1c MFr Bld: 5.9 % — ABNORMAL HIGH (ref 4.8–5.6)

## 2019-11-30 LAB — GLUCOSE, RANDOM: Glucose: 105 mg/dL — ABNORMAL HIGH (ref 65–99)

## 2019-12-04 ENCOUNTER — Telehealth: Payer: Self-pay

## 2019-12-04 NOTE — Telephone Encounter (Signed)
Normal pap letter mailed

## 2019-12-05 ENCOUNTER — Telehealth: Payer: Self-pay

## 2019-12-05 NOTE — Telephone Encounter (Signed)
Health coaching 2     Labs-132 cholesterol, 82 LDL cholesterol, 72 triglycerides , 35 HDL cholesterol , 5.9 hemoglobin A1C , 105 mean plasma glucose. Patient understands and is aware of her lab results.   Goals-  Discussed lab results with patient and answered any questions that patient had regarding results. Goals- Add more heart healthy fats in diet like olive oil, nuts or avocados. Add more whole grains in diet like whole wheat bread, whole grain pasta, brown rice or whole grain cereals. Add in more fiber rich foods in diet and heart healthy fish. Reduce the amount of soda consumed. Patient currently drinks about 1 2 L every other day. Ultimate goal would be 36 oz or less per week. Patient has already tried to start drinking more water instead of sodas. Gave suggestion to try a flavoring or fruit to help with water intake. Also encouraged patient to try and start exercising more.   Navigation:  Patient is aware of 1 more health coaching sessions and a follow up. Patient is scheduled for follow-up appointment with Internal Medicine on March 31, @ 10:15 am for elevated labs and blood pressure.  Time- 16 minutes

## 2019-12-07 NOTE — Progress Notes (Signed)
Patient seen and assessed by nursing staff during this encounter. I have reviewed the chart and agree with the documentation and plan. I have also made any necessary editorial changes.  Catalina Antigua, MD 12/07/2019 2:58 PM

## 2019-12-13 ENCOUNTER — Ambulatory Visit (INDEPENDENT_AMBULATORY_CARE_PROVIDER_SITE_OTHER): Payer: Self-pay | Admitting: Internal Medicine

## 2019-12-13 ENCOUNTER — Encounter: Payer: Self-pay | Admitting: Internal Medicine

## 2019-12-13 DIAGNOSIS — R030 Elevated blood-pressure reading, without diagnosis of hypertension: Secondary | ICD-10-CM

## 2019-12-13 DIAGNOSIS — R7303 Prediabetes: Secondary | ICD-10-CM | POA: Insufficient documentation

## 2019-12-13 DIAGNOSIS — F17200 Nicotine dependence, unspecified, uncomplicated: Secondary | ICD-10-CM

## 2019-12-13 DIAGNOSIS — F17210 Nicotine dependence, cigarettes, uncomplicated: Secondary | ICD-10-CM

## 2019-12-13 MED ORDER — VARENICLINE TARTRATE 1 MG PO TABS: 1.0000 mg | ORAL_TABLET | Freq: Two times a day (BID) | ORAL | 1 refills | Status: DC

## 2019-12-13 MED ORDER — VARENICLINE TARTRATE 1 MG PO TABS: 1.0000 mg | ORAL_TABLET | Freq: Two times a day (BID) | ORAL | 1 refills | Status: AC

## 2019-12-13 MED ORDER — VARENICLINE TARTRATE 0.5 MG X 11 & 1 MG X 42 PO MISC: ORAL | 0 refills | Status: AC

## 2019-12-13 MED FILL — CHANTIX STARTING MONTH BOX: 0.5 MG X 11 | 28 days supply | Qty: 53 | Fill #0

## 2019-12-13 NOTE — Assessment & Plan Note (Signed)
Patient was referred for elevated blood pressure readings, blood pressure of 149/84 during this encounter.  Repeat measurement was with systolics in 140s.  Patient brought home blood pressure monitor, and measurements were consistent between this device and office measurements.  Patient brought log of recorded blood pressures over the past 4 days, systolics ranged from 98-126 and diastolic from 54-85.  A/P: Patient has isolated elevated blood pressures while in office, measurements on home device are within normal limits and home device appears to be appropriately calibrated.  No indication for antihypertensives at this time. *Patient provided with blood pressure log *Review blood pressure log at follow-up appointment

## 2019-12-13 NOTE — Patient Instructions (Addendum)
You were seen for evaluation of your blood pressure and blood sugar. Here are my recommendations:  1) Try to make changes to your diet to avoid sugary beverages and starchy foods. This can help improve your blood sugar  2) Your blood pressure seems to be well controlled based on your home readings. It would be helpful if you continue to occasionally measure your blood pressure at home and record the values  3) I have sent a prescription for chantix to the [facility]. It should cost between $4 and $6 for a 30-day supply  4) Please schedule appointment with financial counselor. There is no charge for this appointment. Please bring the documents on the attached list to this appointment.   Thank you for allowing [place] to be part of your medical care!

## 2019-12-13 NOTE — Assessment & Plan Note (Addendum)
Patient reports smoking 1 pack/day since about 54 years of age.  Patient reports that about 2 to 3 years ago she quit for 2 to 3 months while using Chantix.  Patient is interested in trying Chantix.  Patient states she has never used nicotine gum or patches but does not think it would be effective for her.  A/P: Patient with prolonged history of tobacco use disorder, motivated to quit, had extended duration of cessation previously with Chantix. *Prescription for Chantix sent under IM assistance program *Patient would benefit from routine low-dose chest CT for lung cancer screening given age and pack-year history.  Patient is not able to afford this, will consider ordering once patient is enrolled in financial assistance program
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
## 2019-12-13 NOTE — Assessment & Plan Note (Addendum)
Review of prior laboratory studies reveals hemoglobin A1c of 5.9 and random glucose of 105, consistent with prediabetes.  Patient reports previously drinking 2 L of Mountain Dew per day, frequently eating potatoes, fries, carbohydrate rich foods.  Patient states she has significantly cut down on the amount of [facility] she is drinking.  On review of encounter from 11/29/19, patient similarly reported eating no fruits/vegetables, watching salt intake, but drinking excessive sugary beverages. Patient was counseled during this encounter on dietary modifications.  A/P: Recheck hemoglobin A1c in 3 to 6 months after patient has acquired financial assistance

## 2019-12-16 ENCOUNTER — Ambulatory Visit: Payer: Self-pay | Attending: Internal Medicine

## 2019-12-16 DIAGNOSIS — Z23 Encounter for immunization: Secondary | ICD-10-CM

## 2019-12-21 NOTE — Progress Notes (Signed)
Internal Medicine Clinic Attending  Case discussed with Dr. MacLean at the time of the visit.  We reviewed the resident's history and exam and pertinent patient test results.  I agree with the assessment, diagnosis, and plan of care documented in the resident's note.    

## 2020-01-10 ENCOUNTER — Ambulatory Visit: Payer: Self-pay | Attending: Internal Medicine

## 2020-01-10 DIAGNOSIS — Z23 Encounter for immunization: Secondary | ICD-10-CM

## 2020-01-10 NOTE — Progress Notes (Signed)
   Covid-19 Vaccination Clinic  Name:  Christy Ortiz    MRN: [id number] DOB: 07-30-1966  01/10/2020  [name] was observed post Covid-19 immunization for 15 minutes without incident. She was provided with Vaccine Information Sheet and instruction to access the V-Safe system.   [name] was instructed to call 911 with any severe reactions post vaccine: Marland Kitchen Difficulty breathing  . Swelling of face and throat  . A fast heartbeat  . A bad rash all over body  . Dizziness and weakness   Immunizations Administered    Name Date Dose VIS Date Route   Pfizer COVID-19 Vaccine 01/10/2020 10:16 AM 0.3 mL 11/08/2018 Intramuscular   Manufacturer: [employer], [employer]   Lot: [id number]   NDC: [address]-0

## 2020-02-05 MED FILL — CHANTIX 1 MG TABLET: 1 | 30 days supply | Qty: 60 | Fill #0

## 2020-03-25 MED FILL — CHANTIX 1 MG TABLET: 1 | 30 days supply | Qty: 60 | Fill #1

## 2020-05-08 ENCOUNTER — Telehealth: Payer: Self-pay

## 2020-05-08 NOTE — Telephone Encounter (Signed)
Left message for patient about completing HC 3 for the [employer] program. Left name and number for patient to call back. 

## 2020-10-09 ENCOUNTER — Telehealth: Payer: Self-pay

## 2020-10-09 NOTE — Telephone Encounter (Signed)
Left message with patient about conducting [facility] follow-up left name and number for her to call back.

## 2020-12-13 IMAGING — MG DIGITAL SCREENING BILAT W/ TOMO W/ CAD
6 of 10 series · 6 of 30 positions shown · non-contrast
Comparison: Previous exam(s).

CLINICAL DATA: Screening.

EXAM:
DIGITAL SCREENING BILATERAL MAMMOGRAM WITH TOMO AND CAD

[R CC synth-2D (1 of 2)]
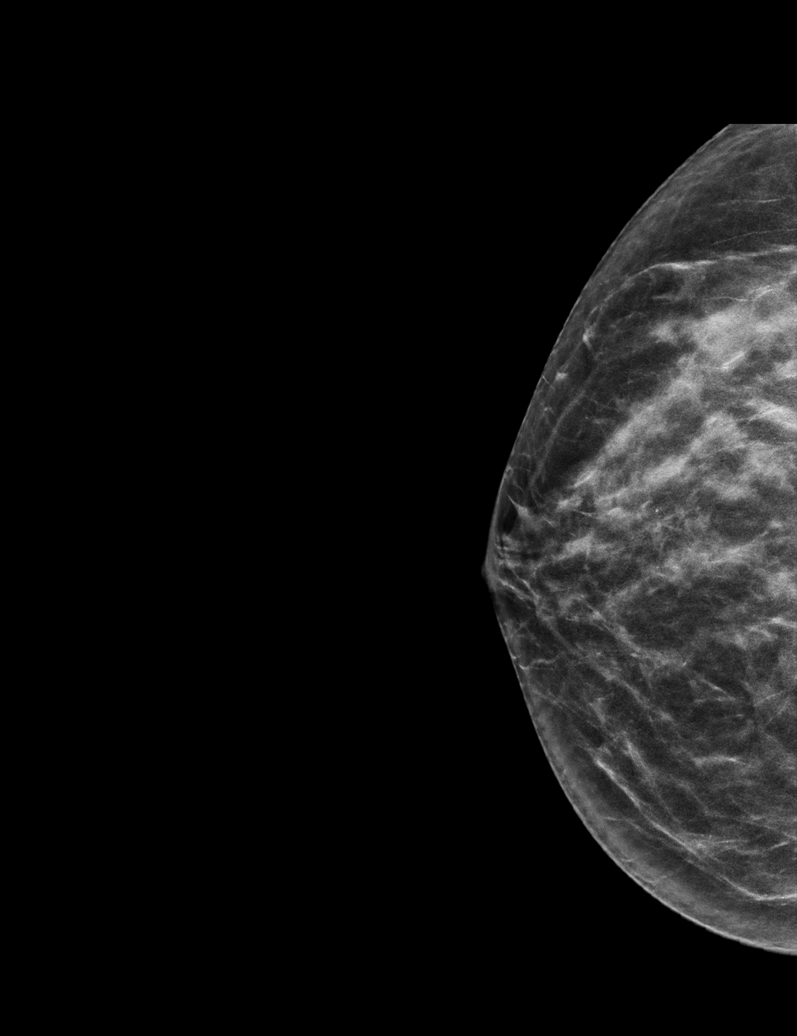

[R MLO synth-2D]
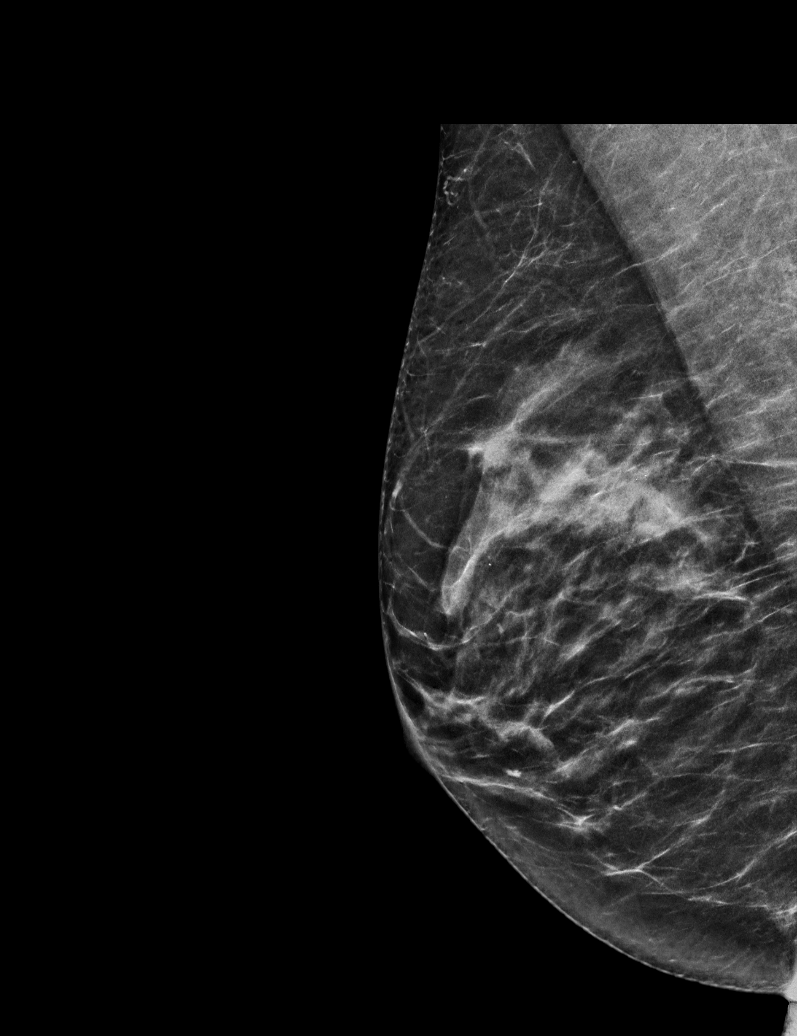

[L MLO synth-2D]
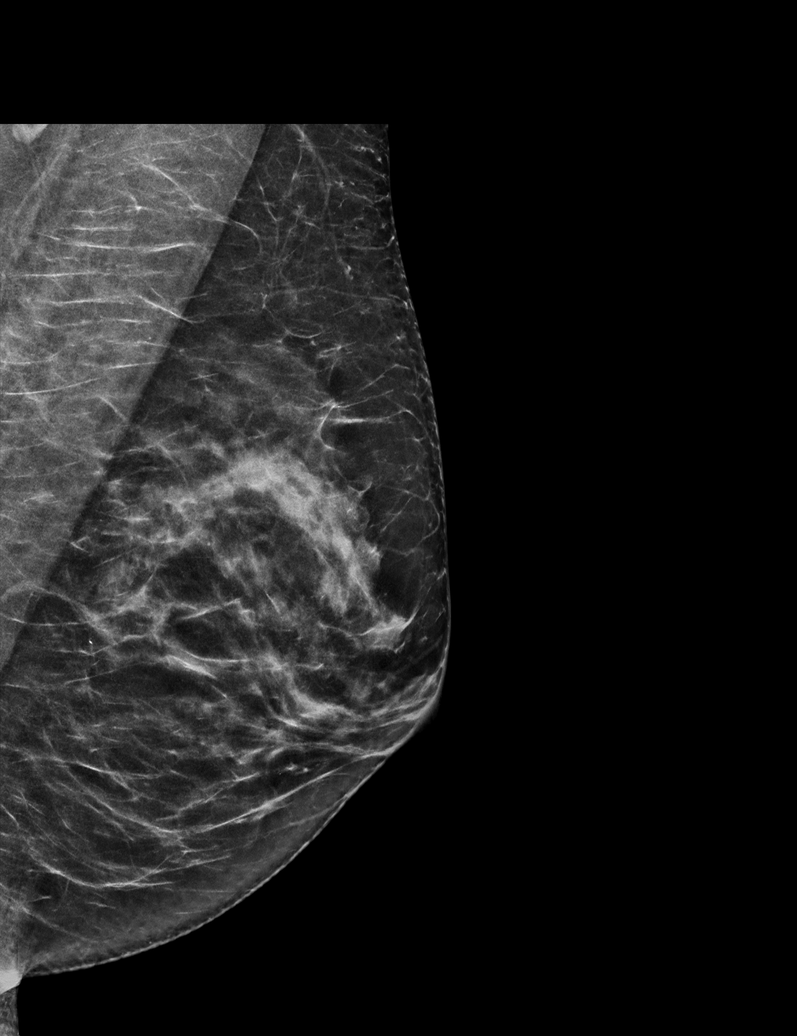

[L CC synth-2D]
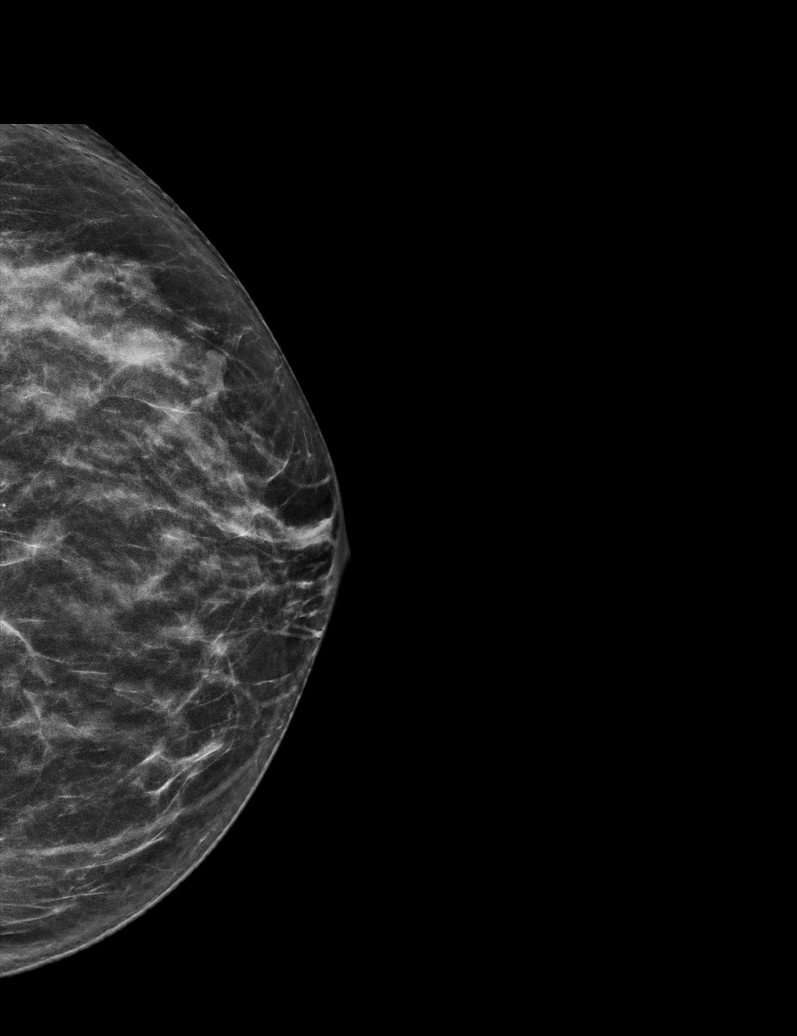

[R CC synth-2D (2 of 2)]
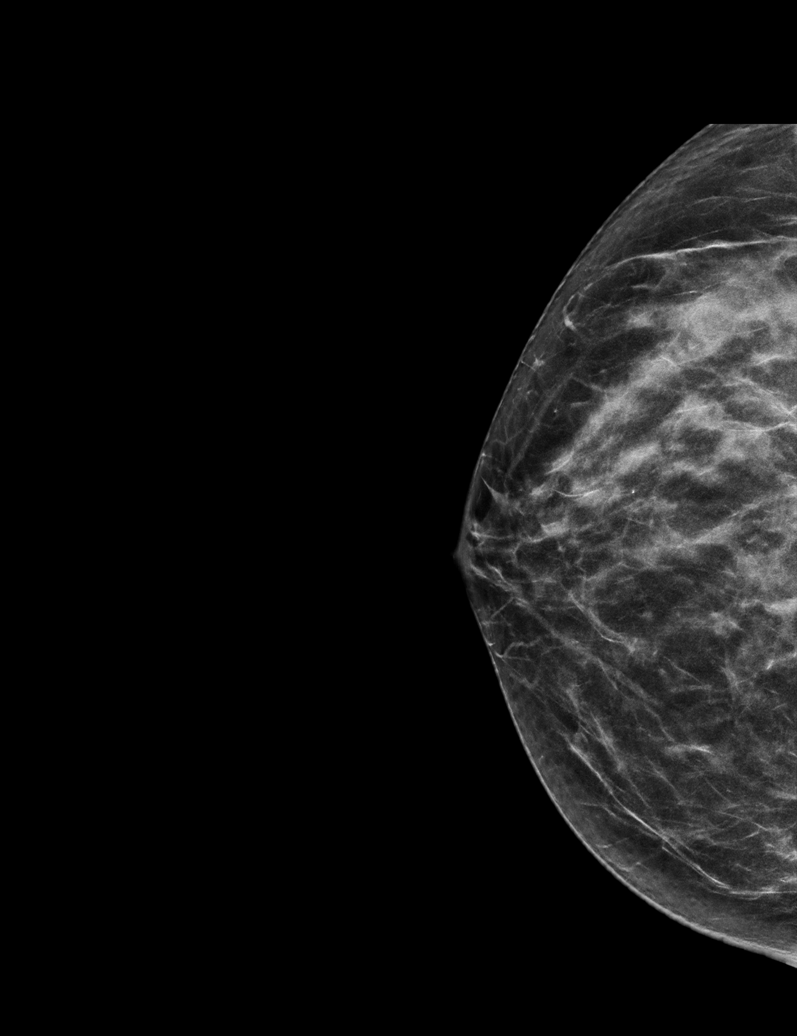

[L MLO tomo · tomo slice 32/63.0]
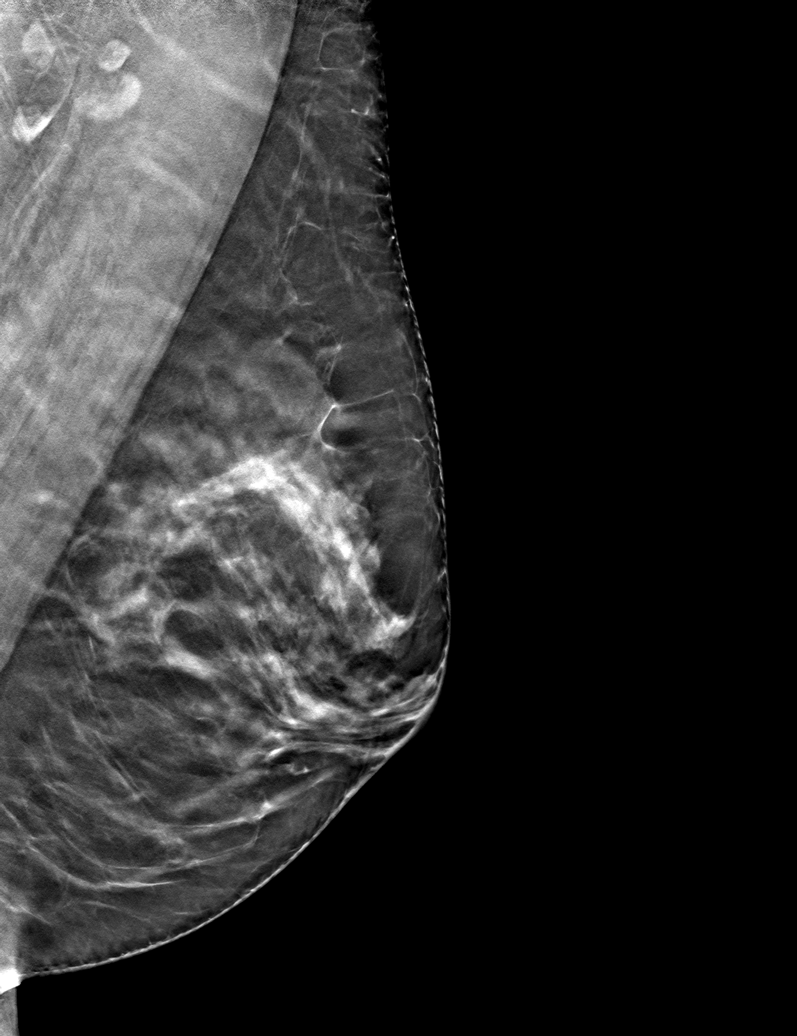

[6 of 30 positions shown; findings below may reference images not displayed]

ACR Breast Density Category c: The breast tissue is heterogeneously
dense, which may obscure small masses.
FINDINGS: There are no findings suspicious for malignancy. Images were
processed with CAD.
IMPRESSION: No mammographic evidence of malignancy. A result letter of this
screening mammogram will be mailed directly to the patient.

RECOMMENDATION:
Screening mammogram in one year. (Code:[id number])

BI-RADS CATEGORY  1: Negative.

## 2022-02-13 ENCOUNTER — Emergency Department (HOSPITAL_BASED_OUTPATIENT_CLINIC_OR_DEPARTMENT_OTHER): Payer: Self-pay

## 2022-02-13 ENCOUNTER — Encounter (HOSPITAL_BASED_OUTPATIENT_CLINIC_OR_DEPARTMENT_OTHER): Payer: Self-pay | Admitting: Pediatrics

## 2022-02-13 ENCOUNTER — Emergency Department (HOSPITAL_BASED_OUTPATIENT_CLINIC_OR_DEPARTMENT_OTHER)
Admission: EM | Admit: 2022-02-13 | Discharge: 2022-02-13 | Disposition: A | Discharge status: Home / Self Care | Payer: Self-pay | Attending: Emergency Medicine | Admitting: Emergency Medicine

## 2022-02-13 ENCOUNTER — Other Ambulatory Visit: Payer: Self-pay

## 2022-02-13 DIAGNOSIS — R197 Diarrhea, unspecified: Secondary | ICD-10-CM | POA: Insufficient documentation

## 2022-02-13 DIAGNOSIS — R112 Nausea with vomiting, unspecified: Secondary | ICD-10-CM | POA: Insufficient documentation

## 2022-02-13 DIAGNOSIS — H538 Other visual disturbances: Secondary | ICD-10-CM | POA: Insufficient documentation

## 2022-02-13 DIAGNOSIS — R059 Cough, unspecified: Secondary | ICD-10-CM | POA: Insufficient documentation

## 2022-02-13 DIAGNOSIS — R519 Headache, unspecified: Secondary | ICD-10-CM | POA: Insufficient documentation

## 2022-02-13 LAB — CBC WITH DIFFERENTIAL/PLATELET
Abs Immature Granulocytes: 0.23 10*3/uL — ABNORMAL HIGH (ref 0.00–0.07)
Basophils Absolute: 0.1 10*3/uL (ref 0.0–0.1)
Basophils Relative: 1 %
Eosinophils Absolute: 0 10*3/uL (ref 0.0–0.5)
Eosinophils Relative: 0 %
HCT: 39.5 % (ref 36.0–46.0)
Hemoglobin: 13.4 g/dL (ref 12.0–15.0)
Immature Granulocytes: 2 %
Lymphocytes Relative: 12 %
Lymphs Abs: 1.3 10*3/uL (ref 0.7–4.0)
MCH: 28.5 pg (ref 26.0–34.0)
MCHC: 33.9 g/dL (ref 30.0–36.0)
MCV: 84 fL (ref 80.0–100.0)
Monocytes Absolute: 0.3 10*3/uL (ref 0.1–1.0)
Monocytes Relative: 3 %
Neutro Abs: 8.9 10*3/uL — ABNORMAL HIGH (ref 1.7–7.7)
Neutrophils Relative %: 82 %
Platelets: 343 10*3/uL (ref 150–400)
RBC: 4.7 MIL/uL (ref 3.87–5.11)
RDW: 13.9 % (ref 11.5–15.5)
WBC: 10.8 10*3/uL — ABNORMAL HIGH (ref 4.0–10.5)
nRBC: 0 % (ref 0.0–0.2)

## 2022-02-13 LAB — COMPREHENSIVE METABOLIC PANEL
ALT: 26 U/L (ref 0–44)
AST: 20 U/L (ref 15–41)
Albumin: 4.8 g/dL (ref 3.5–5.0)
Alkaline Phosphatase: 99 U/L (ref 38–126)
Anion gap: 9 (ref 5–15)
BUN: 14 mg/dL (ref 6–20)
CO2: 26 mmol/L (ref 22–32)
Calcium: 9.5 mg/dL (ref 8.9–10.3)
Chloride: 105 mmol/L (ref 98–111)
Creatinine, Ser: 0.74 mg/dL (ref 0.44–1.00)
GFR, Estimated: >60 mL/min (ref >60)
Glucose, Bld: 133 mg/dL — ABNORMAL HIGH (ref 70–99)
Potassium: 3.7 mmol/L (ref 3.5–5.1)
Sodium: 140 mmol/L (ref 135–145)
Total Bilirubin: 0.6 mg/dL (ref 0.3–1.2)
Total Protein: 8.7 g/dL — ABNORMAL HIGH (ref 6.5–8.1)

## 2022-02-13 LAB — LIPASE, BLOOD: Lipase: 23 U/L (ref 11–51)

## 2022-02-13 MED ORDER — PROCHLORPERAZINE EDISYLATE 10 MG/2ML IJ SOLN
10.0000 mg | Freq: Once | INTRAMUSCULAR | Status: AC
  Administered 2022-02-13: 10 mg via INTRAVENOUS
  Filled 2022-02-13: qty 2

## 2022-02-13 MED ORDER — DIPHENHYDRAMINE HCL 50 MG/ML IJ SOLN
12.5000 mg | Freq: Once | INTRAMUSCULAR | Status: AC
  Administered 2022-02-13: 12.5 mg via INTRAVENOUS
  Filled 2022-02-13: qty 1

## 2022-02-13 MED ORDER — ONDANSETRON HCL 4 MG PO TABS: 4.0000 mg | ORAL_TABLET | Freq: Four times a day (QID) | ORAL | 0 refills | Status: AC

## 2022-02-13 MED ORDER — SODIUM CHLORIDE 0.9 % IV BOLUS
1000.0000 mL | Freq: Once | INTRAVENOUS | Status: AC
  Administered 2022-02-13: 1000 mL via INTRAVENOUS

## 2022-02-13 NOTE — ED Provider Notes (Signed)
[facility] EMERGENCY DEPARTMENT Provider Note   CSN: [id number] Arrival date & time: 02/13/22  1235     History  Chief Complaint  Patient presents with   Headache    Christy Ortiz is a 56 y.o. female.  Patient here mostly for intermittent headaches that she has had for several months.  She has had some nausea and vomiting diarrhea at times.  Has had a cough at times.  She has no history of bad headaches but used to get some in the past but now having more frequent headaches and migraines over the last 9 months.  Denies any weakness or numbness.  Has had some GI symptoms here recently but no abdominal pain, pain with urination, shortness of breath or chest pain.  She states she has had several episodes of nausea and vomiting diarrhea over the last 24 hours.  Has had a nonspecific cough as well.  Nothing has made it worse or better.  The history is provided by the patient.      Home Medications Prior to Admission medications   Medication Sig Start Date End Date Taking? Authorizing Provider  ondansetron (ZOFRAN) 4 MG tablet Take 1 tablet (4 mg total) by mouth every 6 (six) hours. 02/13/22  Yes Vibhav Waddill, DO  varenicline (CHANTIX PAK) 0.5 MG X 11 & 1 MG X 42 tablet Take one 0.5 mg tablet by mouth once daily for 3 days, then increase to one 0.5 mg tablet twice daily for 4 days, then increase to one 1 mg tablet twice daily. 12/13/19   Katherine Roan, MD  varenicline (CHANTIX) 1 MG tablet Take 1 tablet (1 mg total) by mouth 2 (two) times daily. 01/08/20   Katherine Roan, MD      Allergies    Codeine    Review of Systems   Review of Systems  Physical Exam Updated Vital Signs BP (!) 150/83 (BP Location: Left Arm)   Pulse 85   Temp 97.6 F (36.4 C) (Oral)   Resp 18   Ht 5\' 10"  (1.778 m)   Wt 88.5 kg   LMP 04/17/2013   SpO2 98%   BMI 27.98 kg/m  Physical Exam Vitals and nursing note reviewed.  Constitutional:      General: She is not in acute distress.     Appearance: She is well-developed. She is not ill-appearing.  HENT:     Head: Normocephalic and atraumatic.     Mouth/Throat:     Mouth: Mucous membranes are moist.  Eyes:     General: No visual field deficit or scleral icterus.    Extraocular Movements: Extraocular movements intact.     Conjunctiva/sclera: Conjunctivae normal.     Pupils: Pupils are equal, round, and reactive to light. Pupils are equal.  Cardiovascular:     Rate and Rhythm: Normal rate and regular rhythm.     Heart sounds: Normal heart sounds. No murmur heard. Pulmonary:     Effort: Pulmonary effort is normal. No respiratory distress.     Breath sounds: Normal breath sounds.  Abdominal:     Palpations: Abdomen is soft.     Tenderness: There is no abdominal tenderness.  Musculoskeletal:        General: No swelling.     Cervical back: Normal range of motion and neck supple.  Skin:    General: Skin is warm and dry.     Capillary Refill: Capillary refill takes less than 2 seconds.  Neurological:     Mental Status: She  is alert and oriented to person, place, and time.     Cranial Nerves: No cranial nerve deficit, dysarthria or facial asymmetry.     Sensory: No sensory deficit.     Motor: No weakness.     Coordination: Coordination normal.     Comments: 5+ out of 5 strength throughout, normal sensation, no drift, normal finger-nose-finger, normal speech  Psychiatric:        Mood and Affect: Mood normal.    ED Results / Procedures / Treatments   Labs (all labs ordered are listed, but only abnormal results are displayed) Labs Reviewed  CBC WITH DIFFERENTIAL/PLATELET - Abnormal; Notable for the following components:      Result Value   WBC 10.8 (*)    Neutro Abs 8.9 (*)    Abs Immature Granulocytes 0.23 (*)    All other components within normal limits  COMPREHENSIVE METABOLIC PANEL - Abnormal; Notable for the following components:   Glucose, Bld 133 (*)    Total Protein 8.7 (*)    All other components within  normal limits  LIPASE, BLOOD    EKG None  Radiology CT Head Wo Contrast  Result Date: 02/13/2022 CLINICAL DATA:  Headache, new or worsening (Age >= 50y) EXAM: CT HEAD WITHOUT CONTRAST TECHNIQUE: Contiguous axial images were obtained from the base of the skull through the vertex without intravenous contrast. RADIATION DOSE REDUCTION: This exam was performed according to the departmental dose-optimization program which includes automated exposure control, adjustment of the mA and/or kV according to patient size and/or use of iterative reconstruction technique. COMPARISON:  None Available. FINDINGS: Brain: No evidence of acute infarction, hemorrhage, hydrocephalus, extra-axial collection or mass lesion/mass effect. Vascular: No hyperdense vessel or unexpected calcification. Skull: Normal. Negative for fracture or focal lesion. Sinuses/Orbits: Right-sided paranasal sinus mucosal thickening. No air-fluid level. Other: None. IMPRESSION: 1. No acute intracranial abnormality. 2. Right-sided paranasal sinus disease. Electronically Signed   By: Duanne GuessNicholas  Plundo D.O.   On: 02/13/2022 14:17   DG Chest Portable 1 View  Result Date: 02/13/2022 CLINICAL DATA:  Cough EXAM: PORTABLE CHEST 1 VIEW COMPARISON:  Chest x-ray dated Jan 25, 2016 FINDINGS: The heart size and mediastinal contours are within normal limits. Both lungs are clear. The visualized skeletal structures are unremarkable. IMPRESSION: No active disease. Electronically Signed   By: Allegra LaiLeah  Strickland M.D.   On: 02/13/2022 14:20    Procedures Procedures    Medications Ordered in ED Medications  sodium chloride 0.9 % bolus 1,000 mL (1,000 mLs Intravenous New Bag/Given 02/13/22 1334)  prochlorperazine (COMPAZINE) injection 10 mg (10 mg Intravenous Given 02/13/22 1338)  diphenhydrAMINE (BENADRYL) injection 12.5 mg (12.5 mg Intravenous Given 02/13/22 1337)    ED Course/ Medical Decision Making/ A&P                           Medical Decision Making Amount  and/or Complexity of Data Reviewed Labs: ordered. Radiology: ordered.  Risk Prescription drug management.   Christy StakesDeborah Ortiz is here mostly for headache.  Has had nonspecific cough and some GI symptoms recently. No abdominal pain or chest pain or shortness of breath.  She is mostly here for frontal intermittent headache that she has had on and off for 9 months.  More frequent.  History of headaches in the past but not this frequent.  Feeling nauseous and blurred vision at times with her headaches especially now.  Neurologically she is intact.  Differential diagnosis is likely migraine versus tension  type headache.  I have very low suspicion for stroke given history and physical but will get a CT scan of the head to rule out head bleed.    Also with nausea vomiting diarrhea which could also be why she has headaches.  Could be dehydrated.  She does not have any abdominal pain.  No suspicious food intake or recent antibiotics.  Suspect likely viral process or foodborne illness.  We will check basic labs and a chest x-ray to evaluate for electrolyte abnormality, infectious process.  Will give headache cocktail with Benadryl, Compazine, IV fluids and reevaluate.  Per my review and interpretation of labs there is no significant anemia, electrolyte abnormality, kidney injury.  Liver enzymes and gallbladder enzymes within normal limits.  No concern for cholecystitis.  Head CT unremarkable.  Chest x-ray with no evidence of pneumonia pneumothorax per my review and interpretation.  Overall lab work reassuring.  Suspect migraine type headache.  Worse given likely viral GI process.  Will prescribe Zofran.  Feeling better after headache cocktail.  Discharged in good condition.  This chart was dictated using voice recognition software.  Despite best efforts to proofread,  errors can occur which can change the documentation meaning.         Final Clinical Impression(s) / ED Diagnoses Final diagnoses:  Bad  headache  Nausea and vomiting, unspecified vomiting type    Rx / DC Orders ED Discharge Orders          Ordered    ondansetron (ZOFRAN) 4 MG tablet  Every 6 hours        02/13/22 1435    Ambulatory referral to Neurology       Comments: An appointment is requested in approximately: 2 weeks   02/13/22 1435              Virgina Norfolk, DO 02/13/22 1436

## 2022-02-13 NOTE — ED Triage Notes (Signed)
Patient presents with multiple complaints; c/o headache and blurry vision, night sweats, dry heaves, constant ongoing cough,
